# Patient Record
Sex: Female | Born: 2000 | Race: White | Hispanic: Refuse to answer | Marital: Single
Health system: Midwestern US, Academic
[De-identification: ages and names within clinical notes are randomized; demographics above are authoritative.]

---
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
## 2021-11-11 IMAGING — MR MR cervical spine wo con
1 series · 10 of 10 positions shown · IV contrast (multihance)
Comparison: None available
COMPARISON: None available

------------- REPORT [id number] -------------
STUDY TYPE: MRI brain without and with IV contrast, MRI cervical spine without and with IV contrast
TECHNIQUE: Multiplanar multi echo MR imaging was acquired through the brain and cervical spine without and with IV contrast material. 10 mL MultiHance contrast material was injected for the postcontrast portion of the exam.
HISTORY: Paresthesia of skin, headache, right neck swelling

[Series 301: T2 · 10 of 15 frames shown]
[frame 1/15]
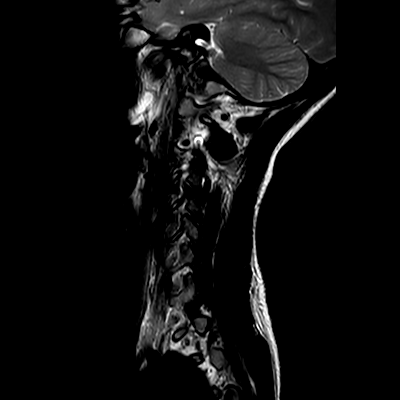
[frame 2/15]
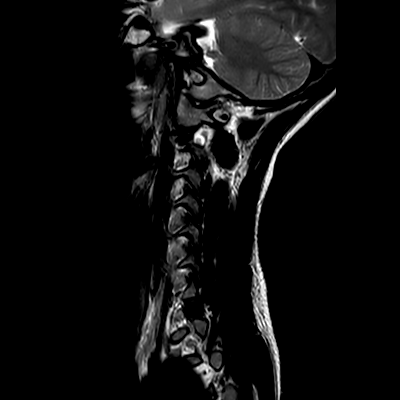
[frame 4/15]
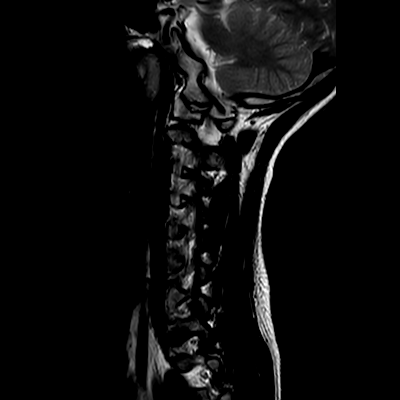
[frame 5/15]
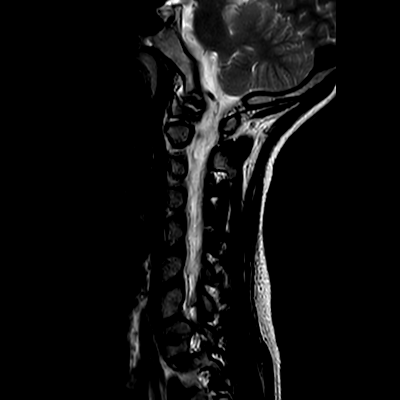
[frame 7/15]
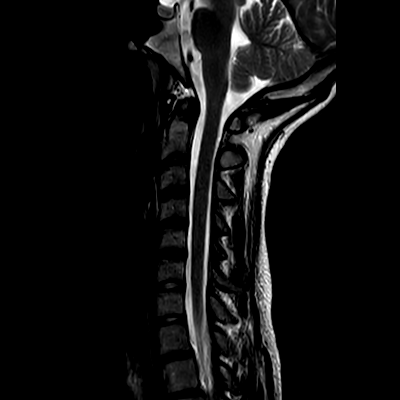
[frame 8/15]
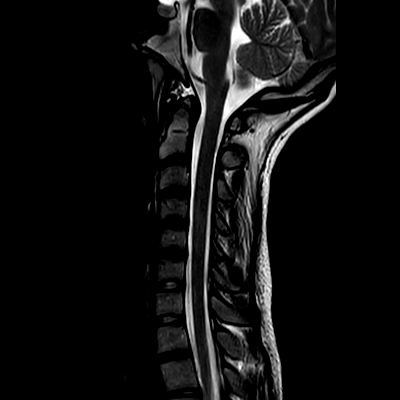
[frame 10/15]
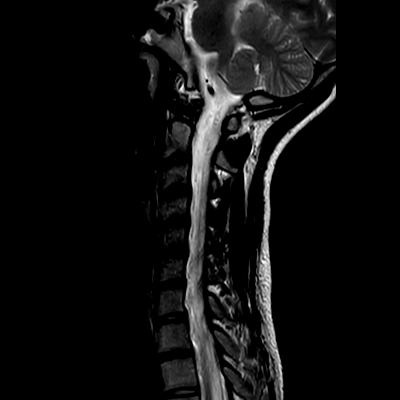
[frame 11/15]
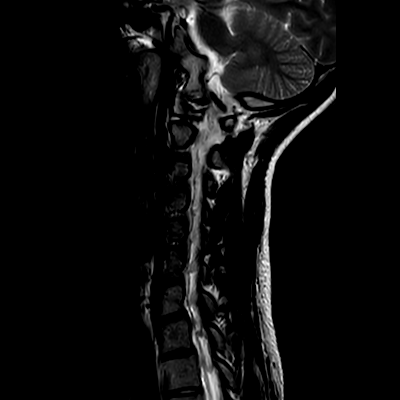
[frame 13/15]
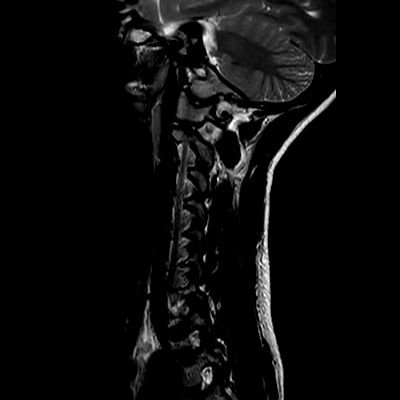
[frame 15/15]
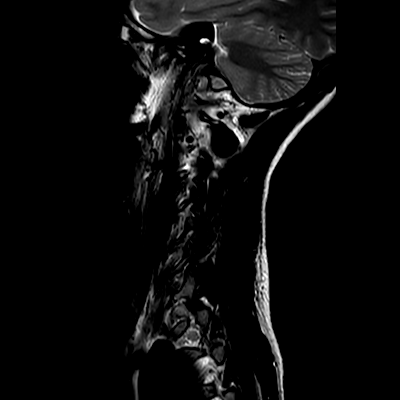

[10 of 10 positions shown; findings below may reference images not displayed]

FINDINGS: MRI brain: There is no diffusion restriction to suggest acute or subacute intracranial ischemia. Gray-white differentiation is maintained. There are grossly normal intracranial flow voids in the paranasal sinuses are well aerated. No abnormal signal is seen within the supratentorial infratentorial brain parenchyma. Midline structures appear normal. This includes pituitary gland, corpus callosum, cerebellar tonsils and 4th ventricle.
Postcontrast images Jusino the high convexity of the brain on the axial images. There is no abnormal enhancing intracranial lesion identified. There is no abnormal dural or meningeal enhancement.
MRI cervical spine: Craniocervical and atlantoaxial alignment are maintained. Cervical alignment is maintained with straightening of the normal cervical lordosis which can be seen with muscle spasm or positioning. Very minimal disc desiccation is seen at C2-C3, C3-C4, C4-C5 and C5-C6. There is very minimal posterior disc bulging at C4-C5 and C5-C6. Facets are aligned. There is no acute compression deformity. There is no abnormal collection within the thecal sac. Paraspinal muscles appear symmetric. Cervical spinal cord demonstrates normal cord signal and morphology. Postcontrast images demonstrate no abnormal enhancing lesion seen within the cervical spine. There is no suspicious dural or epidural collection. No abnormal enhancing lesion is seen within the cervical cord itself. significant there is no significant central foraminal narrowing seen within the cervical spine.
IMPRESSION: MRI brain:
1. No acute intracranial abnormality or hemorrhage. No abnormal signal intensity.
2. No abnormal enhancing intracranial lesion. No evidence of demyelination.
MRI cervical spine:
1. Mild straightening of the normal cervical lordosis which can be seen with muscle spasm or positioning.
2. No cervical compression deformity or significant central/foraminal narrowing.
3. Minimal mid cervical disc desiccation with very minimal posterior disc bulging at C4-C5 and C5-C6.
4. No abnormal cervical cord signal abnormality or abnormal enhancing lesion seen within the cervical spine.

------------- REPORT [id number] -------------
STUDY TYPE: MRI brain without and with IV contrast, MRI cervical spine without and with IV contrast
FINDINGS: MRI brain: There is no diffusion restriction to suggest acute or subacute intracranial ischemia. Gray-white differentiation is maintained. There are grossly normal intracranial flow voids in the paranasal sinuses are well aerated. No abnormal signal is seen within the supratentorial infratentorial brain parenchyma. Midline structures appear normal. This includes pituitary gland, corpus callosum, cerebellar tonsils and 4th ventricle.
Postcontrast images Jusino the high convexity of the brain on the axial images. There is no abnormal enhancing intracranial lesion identified. There is no abnormal dural or meningeal enhancement.
MRI cervical spine: Craniocervical and atlantoaxial alignment are maintained. Cervical alignment is maintained with straightening of the normal cervical lordosis which can be seen with muscle spasm or positioning. Very minimal disc desiccation is seen at C2-C3, C3-C4, C4-C5 and C5-C6. There is very minimal posterior disc bulging at C4-C5 and C5-C6. Facets are aligned. There is no acute compression deformity. There is no abnormal collection within the thecal sac. Paraspinal muscles appear symmetric. Cervical spinal cord demonstrates normal cord signal and morphology. Postcontrast images demonstrate no abnormal enhancing lesion seen within the cervical spine. There is no suspicious dural or epidural collection. No abnormal enhancing lesion is seen within the cervical cord itself. significant there is no significant central foraminal narrowing seen within the cervical spine.
IMPRESSION: MRI brain:
1. No acute intracranial abnormality or hemorrhage. No abnormal signal intensity.
2. No abnormal enhancing intracranial lesion. No evidence of demyelination.
MRI cervical spine:
1. Mild straightening of the normal cervical lordosis which can be seen with muscle spasm or positioning.
2. No cervical compression deformity or significant central/foraminal narrowing.
3. Minimal mid cervical disc desiccation with very minimal posterior disc bulging at C4-C5 and C5-C6.
4. No abnormal cervical cord signal abnormality or abnormal enhancing lesion seen within the cervical spine.

## 2021-11-11 IMAGING — MR MR head/brain wo/w con
1 series · 10 of 10 positions shown · IV contrast (multihance)
Comparison: None available

STUDY TYPE: MRI brain without and with IV contrast, MRI cervical spine without and with IV contrast
TECHNIQUE: Multiplanar multi echo MR imaging was acquired through the brain and cervical spine without and with IV contrast material. 10 mL MultiHance contrast material was injected for the postcontrast portion of the exam.
HISTORY: Paresthesia of skin, headache, right neck swelling

[Series 1301: DWI · 10 of 48 frames shown]
[frame 1/48]
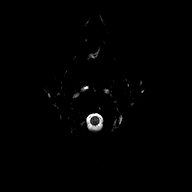
[frame 6/48]
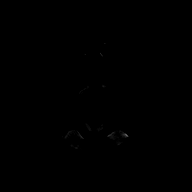
[frame 11/48]
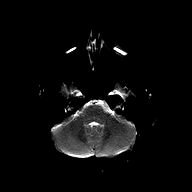
[frame 16/48]
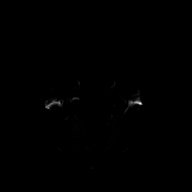
[frame 21/48]
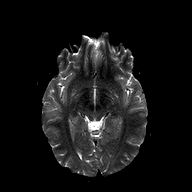
[frame 27/48]
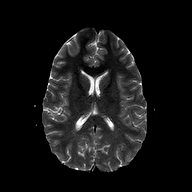
[frame 32/48]
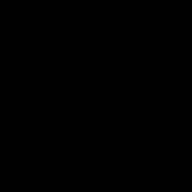
[frame 37/48]
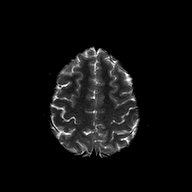
[frame 42/48]
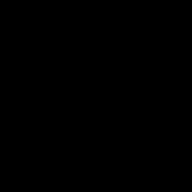
[frame 48/48]
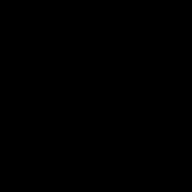

[10 of 10 positions shown; findings below may reference images not displayed]

FINDINGS: MRI brain: There is no diffusion restriction to suggest acute or subacute intracranial ischemia. Gray-white differentiation is maintained. There are grossly normal intracranial flow voids in the paranasal sinuses are well aerated. No abnormal signal is seen within the supratentorial infratentorial brain parenchyma. Midline structures appear normal. This includes pituitary gland, corpus callosum, cerebellar tonsils and 4th ventricle.
Postcontrast images [place] the high convexity of the brain on the axial images. There is no abnormal enhancing intracranial lesion identified. There is no abnormal dural or meningeal enhancement.
MRI cervical spine: Craniocervical and atlantoaxial alignment are maintained. Cervical alignment is maintained with straightening of the normal cervical lordosis which can be seen with muscle spasm or positioning. Very minimal disc desiccation is seen at C2-C3, C3-C4, C4-C5 and C5-C6. There is very minimal posterior disc bulging at C4-C5 and C5-C6. Facets are aligned. There is no acute compression deformity. There is no abnormal collection within the thecal sac. Paraspinal muscles appear symmetric. Cervical spinal cord demonstrates normal cord signal and morphology. Postcontrast images demonstrate no abnormal enhancing lesion seen within the cervical spine. There is no suspicious dural or epidural collection. No abnormal enhancing lesion is seen within the cervical cord itself. significant there is no significant central foraminal narrowing seen within the cervical spine.
IMPRESSION: MRI brain:
1. No acute intracranial abnormality or hemorrhage. No abnormal signal intensity.
2. No abnormal enhancing intracranial lesion. No evidence of demyelination.
MRI cervical spine:
1. Mild straightening of the normal cervical lordosis which can be seen with muscle spasm or positioning.
2. No cervical compression deformity or significant central/foraminal narrowing.
3. Minimal mid cervical disc desiccation with very minimal posterior disc bulging at C4-C5 and C5-C6.
4. No abnormal cervical cord signal abnormality or abnormal enhancing lesion seen within the cervical spine.

## 2024-01-23 ENCOUNTER — Encounter: Admit: 2024-01-23 | Discharge: 2024-01-23 | Payer: BLUE CROSS/BLUE SHIELD

## 2024-07-27 ENCOUNTER — Encounter: Admit: 2024-07-27 | Discharge: 2024-07-27 | Payer: BLUE CROSS/BLUE SHIELD
